# Patient Record
Sex: Female | Born: 2005 | Race: Black or African American | Hispanic: No | Marital: Single | Smoking: Never smoker
Health system: Southern US, Community
[De-identification: ages and names within clinical notes are randomized; demographics above are authoritative.]

## PROBLEM LIST (undated history)

## (undated) ENCOUNTER — Emergency Department (HOSPITAL_COMMUNITY): Discharge status: Against Medical Advice | Payer: Medicaid Other

---
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
## 2023-03-11 ENCOUNTER — Ambulatory Visit
Admission: EM | Admit: 2023-03-11 | Discharge: 2023-03-11 | Disposition: A | Discharge status: Home / Self Care | Payer: Medicaid Other | Attending: Nurse Practitioner | Admitting: Nurse Practitioner

## 2023-03-11 DIAGNOSIS — J029 Acute pharyngitis, unspecified: Secondary | ICD-10-CM

## 2023-03-11 LAB — POCT RAPID STREP A (OFFICE): Rapid Strep A Screen: NEGATIVE

## 2023-03-11 MED ORDER — AMOXICILLIN 500 MG PO CAPS: 500.0000 mg | ORAL_CAPSULE | Freq: Two times a day (BID) | ORAL | 0 refills | Status: AC

## 2023-03-11 NOTE — ED Provider Notes (Signed)
[facility]    CSN: [id number] Arrival date & time: 03/11/23  1705      History   Chief Complaint Chief Complaint  Patient presents with   Sore Throat    HPI Dawn Ferguson is a 17 y.o. female  presents for evaluation of URI symptoms for 2 days. Patient reports associated symptoms of sore throat, fever, body aches, headache, swollen glands. Denies N/V/D, cough, congestion, shortness of breath. Patient does not have a hx of asthma or smoking. No known sick contacts.  Pt has taken Tylenol, ibuprofen, salt water gargles and warm liquids OTC for symptoms. Pt has no other concerns at this time.    Sore Throat Associated symptoms include headaches.    History reviewed. No pertinent past medical history.  There are no problems to display for this patient.   History reviewed. No pertinent surgical history.  OB History   No obstetric history on file.      Home Medications    Prior to Admission medications   Medication Sig Start Date End Date Taking? Authorizing Provider  amoxicillin (AMOXIL) 500 MG capsule Take 1 capsule (500 mg total) by mouth 2 (two) times daily for 10 days. 03/11/23 03/21/23 Yes Radford Pax, NP  ondansetron (ZOFRAN-ODT) 4 MG disintegrating tablet Take 1 tablet (4 mg total) by mouth every 8 (eight) hours as needed for nausea or vomiting. 10/15/16   Street, [place], PA-C    Family History History reviewed. No pertinent family history.  Social History Social History   Tobacco Use   Smoking status: Never   Smokeless tobacco: Former  Substance Use Topics   Alcohol use: No   Drug use: No     Allergies   Patient has no known allergies.   Review of Systems Review of Systems  Constitutional:  Positive for fever.  HENT:  Positive for sore throat.   Musculoskeletal:  Positive for myalgias.  Neurological:  Positive for headaches.     Physical Exam Triage Vital Signs ED Triage Vitals  Enc Vitals Group     BP 03/11/23 1750 130/82      Pulse Rate 03/11/23 1750 (!) 104     Resp 03/11/23 1750 15     Temp 03/11/23 1750 (!) 100.6 F (38.1 C)     Temp Source 03/11/23 1750 Oral     SpO2 03/11/23 1750 97 %     Weight --      Height --      Head Circumference --      Peak Flow --      Pain Score 03/11/23 1749 8     Pain Loc --      Pain Edu? --      Excl. in GC? --    No data found.  Updated Vital Signs BP 130/82 (BP Location: Right Arm)   Pulse (!) 104   Temp (!) 100.6 F (38.1 C) (Oral)   Resp 15   LMP  (LMP Unknown)   SpO2 97%   Visual Acuity Right Eye Distance:   Left Eye Distance:   Bilateral Distance:    Right Eye Near:   Left Eye Near:    Bilateral Near:     Physical Exam Vitals and nursing note reviewed.  Constitutional:      General: She is not in acute distress.    Appearance: She is well-developed. She is not ill-appearing.  HENT:     Head: Normocephalic and atraumatic.     Right Ear: Tympanic membrane  and ear canal normal.     Left Ear: Tympanic membrane and ear canal normal.     Nose: No congestion.     Mouth/Throat:     Mouth: Mucous membranes are moist.     Pharynx: Oropharynx is clear. Uvula midline. Posterior oropharyngeal erythema present.     Tonsils: No tonsillar exudate or tonsillar abscesses.  Eyes:     Conjunctiva/sclera: Conjunctivae normal.     Pupils: Pupils are equal, round, and reactive to light.  Cardiovascular:     Rate and Rhythm: Normal rate and regular rhythm.     Heart sounds: Normal heart sounds.  Pulmonary:     Effort: Pulmonary effort is normal.     Breath sounds: Normal breath sounds.  Musculoskeletal:     Cervical back: Normal range of motion and neck supple.  Lymphadenopathy:     Cervical: Cervical adenopathy present.  Skin:    General: Skin is warm and dry.  Neurological:     General: No focal deficit present.     Mental Status: She is alert and oriented to person, place, and time.  Psychiatric:        Mood and Affect: Mood normal.         Behavior: Behavior normal.    Centor criteria   Tonsillar exudates 0  Tender anterior cervical lymphadenopathy 1  Fever 1  Absence of cough 1  The Centor criteria are used to determine the likelihood of GAS in adults. One point is given for each criterion;  the likelihood of GAS pharyngitis increases as total points rise.  We generally test for GAS in patients with ?3 Centor criteria; patients with Centor criteria <3 are unlikely to have GAS pharyngitis     UC Treatments / Results  Labs (all labs ordered are listed, but only abnormal results are displayed) Labs Reviewed  CULTURE, GROUP A STREP [facility])  POCT RAPID STREP A (OFFICE)    EKG   Radiology No results found.  Procedures Procedures (including critical care time)  Medications Ordered in UC Medications - No data to display  Initial Impression / Assessment and Plan / UC Course  I have reviewed the triage vital signs and the nursing notes.  Pertinent labs & imaging results that were available during my care of the patient were reviewed by me and considered in my medical decision making (see chart for details).     Viewed exam and symptoms with patient.  No red flags.  Rapid strep is negative will culture.  Given exam and Centor score we will start amoxicillin twice daily for 10 days Continue over-the-counter analgesics and salt gargles/warm liquids PCP follow-up if symptoms do not improve ER precautions reviewed and patient verbalized understanding Final Clinical Impressions(s) / UC Diagnoses   Final diagnoses:  Sore throat  Acute pharyngitis, unspecified etiology     Discharge Instructions      Start amoxicillin twice daily for 10 days Continue ibuprofen and Tylenol as well as salt water gargles and warm liquids Follow-up with your PCP if your symptoms do not improve Please go to the ER for any worsening symptoms Hope you feel better soon!   ED Prescriptions     Medication Sig Dispense Auth.  Provider   amoxicillin (AMOXIL) 500 MG capsule Take 1 capsule (500 mg total) by mouth 2 (two) times daily for 10 days. 20 capsule Radford Pax, NP      PDMP not reviewed this encounter.   Radford Pax, NP 03/11/23 [phone]

## 2023-03-11 NOTE — Discharge Instructions (Addendum)
Start amoxicillin twice daily for 10 days Continue ibuprofen and Tylenol as well as salt water gargles and warm liquids Follow-up with your PCP if your symptoms do not improve Please go to the ER for any worsening symptoms Hope you feel better soon!

## 2023-03-11 NOTE — ED Triage Notes (Signed)
Pt presents with c/o sore throat x 2 days, it worsened this morning. Has headaches, fever, painful swallowing.   Has taken fever reducer.

## 2023-03-12 LAB — CULTURE, GROUP A STREP (THRC)

## 2023-03-13 LAB — CULTURE, GROUP A STREP (THRC)

## 2023-03-14 LAB — CULTURE, GROUP A STREP (THRC)
